# Patient Record
Sex: Female | Born: 1940 | Race: White | Hispanic: No | Marital: Married | Smoking: Never smoker
Health system: Southern US, Community
[De-identification: ages and names within clinical notes are randomized; demographics above are authoritative.]

## PROBLEM LIST (undated history)

## (undated) DIAGNOSIS — I10 Essential (primary) hypertension: Secondary | ICD-10-CM

## (undated) HISTORY — DX: Essential (primary) hypertension: I10

---

## 2003-10-21 ENCOUNTER — Emergency Department (HOSPITAL_COMMUNITY): Admission: EM | Admit: 2003-10-21 | Discharge: 2003-10-21 | Payer: Self-pay | Admitting: Emergency Medicine

## 2004-01-11 ENCOUNTER — Ambulatory Visit (HOSPITAL_BASED_OUTPATIENT_CLINIC_OR_DEPARTMENT_OTHER): Admission: RE | Admit: 2004-01-11 | Discharge: 2004-01-11 | Payer: Self-pay | Admitting: Specialist

## 2004-01-11 ENCOUNTER — Ambulatory Visit (HOSPITAL_COMMUNITY): Admission: RE | Admit: 2004-01-11 | Discharge: 2004-01-11 | Payer: Self-pay | Admitting: Specialist

## 2006-06-08 ENCOUNTER — Emergency Department (HOSPITAL_COMMUNITY): Admission: EM | Admit: 2006-06-08 | Discharge: 2006-06-08 | Payer: Self-pay | Admitting: Emergency Medicine

## 2006-06-11 ENCOUNTER — Encounter (HOSPITAL_COMMUNITY): Admission: RE | Admit: 2006-06-11 | Discharge: 2006-09-08 | Payer: Self-pay | Admitting: Family Medicine

## 2006-10-18 ENCOUNTER — Emergency Department (HOSPITAL_COMMUNITY): Admission: EM | Admit: 2006-10-18 | Discharge: 2006-10-18 | Payer: Self-pay | Admitting: Emergency Medicine

## 2007-01-22 ENCOUNTER — Encounter: Admission: RE | Admit: 2007-01-22 | Discharge: 2007-01-22 | Payer: Self-pay | Admitting: Family Medicine

## 2007-11-07 ENCOUNTER — Emergency Department (HOSPITAL_COMMUNITY): Admission: EM | Admit: 2007-11-07 | Discharge: 2007-11-07 | Payer: Self-pay | Admitting: Emergency Medicine

## 2008-01-31 ENCOUNTER — Encounter: Admission: RE | Admit: 2008-01-31 | Discharge: 2008-01-31 | Payer: Self-pay | Admitting: Family Medicine

## 2010-05-24 NOTE — Op Note (Signed)
Leah Castaneda, Leah Castaneda              ACCOUNT NO.:  [id number]   MEDICAL RECORD NO.:  [id number]          PATIENT TYPE:  AMB   LOCATION:  [facility]                         FACILITY:  [facility]   PHYSICIAN:  Jene Every, M.D.    DATE OF BIRTH:  1940/05/04   DATE OF PROCEDURE:  01/11/2004  DATE OF DISCHARGE:                                 OPERATIVE REPORT   PREOPERATIVE DIAGNOSES:  1.  Medial meniscal tear, left knee.  2.  Degenerative joint disease.   POSTOPERATIVE DIAGNOSES:  1.  Medial meniscal tear, left knee.  2.  Degenerative joint disease.   PROCEDURE PERFORMED:  Left knee arthroscopy with partial medial meniscectomy  and chondroplasty of medial femoral condyle and medial tibial plateau of  patella, removal of loose body.   BRIEF HISTORY AND INDICATION:  A 70 year old with refractory knee pain, MRI  indicating meniscal tear and degenerative changes.  Operative intervention  was indicated for a partial medial meniscectomy and debridement.  Risks and  benefits were discussed including bleeding, infection, __________ , no  change in symptoms, worsening symptoms and need for repeat in the future or  a total knee arthroplasty, etc.   TECHNIQUE:  The patient was placed in a supine position and after receiving  adequate general anesthesia and 1 g of Kefzol, the left lower extremity was  prepped and draped in the usual sterile fashion.  A lateral parapatellar  portal and superomedial parapatellar portal was fashioned with a #11 blade  and the ingress cannulae atraumatically placed.  __________  utilized to  insufflate the joint.   The ingress cannulae were atraumatically placed under direct visualization  and the medial parapatellar portal was fashioned with a #11 blade after  localization with an 18-gauge needle, sparing the medial meniscus.  Inspection revealed some grade 3 changes of the patella.  There was normal  patellofemoral tracking.  The medial compartment revealed extensive  grade 3  changes of the medial femoral condyle and tibial plateau.  There were loose  cartilaginous bodies noticed as well and a posterior horn medial meniscus  tear that was somewhat unstable to probe palpation.  There was no bucket-  handle tear that was described by the MRI, however.  We introduced a shaver  and utilized it to perform a chondroplasty of the medial femoral condyle and  tibial plateau, and removed the loose bodies with a straight basket and a  pituitary rongeur, and performed a partial medial meniscectomy with a  straight basket and further contoured with a 4.2 Cuda shaver to a stable  base.  The remnant of the other meniscus was stable; the majority of it was  still remaining, however.  I found no grade 4 changes though.  With flexion  and extension we fully debrided the compartment.  ACL and PCL were  attenuated.  The lateral compartment revealed some mild grade 3 changes of  the femoral condyle; this was shaved.  The meniscus and tibial plateau were  unremarkable.  The gutters were unremarkable.  We fully lavaged all  compartments, reexamined the medial meniscus and the residual was stable to  probe palpation and no evidence of residual loose cartilaginous bodies.  The  knee was copiously lavaged and all instrumentation was removed.  Portals  were closed with 4-0 nylon simple interrupted sutures.  Marcaine 0.25% with epinephrine was infiltrated in the in the joint.  Wound  was dressed sterilely.  She was awoken without difficulty and transported to  the recovery room in satisfactory condition.   The patient tolerated the procedure well with no complications and minimal  blood loss.     Trey Paula   JB/MEDQ  D:  01/11/2004  T:  01/11/2004  Job:  [id number]

## 2010-08-24 ENCOUNTER — Emergency Department (HOSPITAL_COMMUNITY)
Admission: EM | Admit: 2010-08-24 | Discharge: 2010-08-24 | Disposition: A | Discharge status: Home / Self Care | Payer: Medicare Other | Attending: Emergency Medicine | Admitting: Emergency Medicine

## 2010-08-24 DIAGNOSIS — I10 Essential (primary) hypertension: Secondary | ICD-10-CM | POA: Insufficient documentation

## 2010-08-24 DIAGNOSIS — R1310 Dysphagia, unspecified: Secondary | ICD-10-CM | POA: Insufficient documentation

## 2010-08-24 DIAGNOSIS — E7800 Pure hypercholesterolemia, unspecified: Secondary | ICD-10-CM | POA: Insufficient documentation

## 2010-08-26 ENCOUNTER — Ambulatory Visit (HOSPITAL_COMMUNITY)
Admit: 2010-08-26 | Discharge: 2010-08-26 | Disposition: A | Discharge status: Home / Self Care | Payer: Medicare Other | Attending: Emergency Medicine | Admitting: Emergency Medicine

## 2010-08-26 DIAGNOSIS — K224 Dyskinesia of esophagus: Secondary | ICD-10-CM | POA: Insufficient documentation

## 2010-08-26 DIAGNOSIS — R1310 Dysphagia, unspecified: Secondary | ICD-10-CM | POA: Insufficient documentation

## 2010-08-26 DIAGNOSIS — K225 Diverticulum of esophagus, acquired: Secondary | ICD-10-CM | POA: Insufficient documentation

## 2010-08-26 DIAGNOSIS — K229 Disease of esophagus, unspecified: Secondary | ICD-10-CM | POA: Insufficient documentation

## 2013-12-06 ENCOUNTER — Other Ambulatory Visit: Payer: Self-pay | Admitting: Neurology

## 2013-12-06 ENCOUNTER — Encounter: Payer: Self-pay | Admitting: Neurology

## 2013-12-06 ENCOUNTER — Ambulatory Visit (INDEPENDENT_AMBULATORY_CARE_PROVIDER_SITE_OTHER): Payer: Medicare Other | Admitting: Neurology

## 2013-12-06 VITALS — BP 116/62 | HR 70 | Temp 97.5°F | Resp 18 | Ht 65.0 in | Wt 134.9 lb

## 2013-12-06 DIAGNOSIS — G309 Alzheimer's disease, unspecified: Secondary | ICD-10-CM

## 2013-12-06 DIAGNOSIS — F0280 Dementia in other diseases classified elsewhere without behavioral disturbance: Secondary | ICD-10-CM

## 2013-12-06 LAB — TSH: TSH: 1.393 u[IU]/mL (ref 0.350–4.500)

## 2013-12-06 LAB — VITAMIN B12: Vitamin B-12: 231 pg/mL (ref 211–911)

## 2013-12-06 MED ORDER — MEMANTINE HCL 10 MG PO TABS: ORAL_TABLET | ORAL | Status: DC

## 2013-12-06 NOTE — Progress Notes (Signed)
NEUROLOGY CONSULTATION NOTE  Leah Castaneda MRN: [id number] DOB: 1940/12/25  Referring provider: Dr. Manus GunningEhinger Primary care provider: Dr. Manus GunningEhinger  Reason for consult:  Memory loss  HISTORY OF PRESENT ILLNESS: Leah Castaneda is a 73 year old right-handed woman with hypertension, hypercholesterolemia, and hearing loss who presents for memory loss.  Records and labs reviewed.  Additional history obtained from her daughter.  She began having memory problems shortly after her husband passed away in February 2014.  At first, it was thought to be related to depression, but the memory problems gradually progressed.  She often forgets where she placed objects, such as in the kitchen.  On occasion, she has left her keys and wallet at the grocery store.  She has at least twice gotten lost while driving.  However, on one of those occasions, it occurred at night where there was construction taking place.  She did not have any accidents or near-accidents.  She will have both word-finding difficulties and problems getting words out.  Sometimes, she will refer to "he" as "she" or vice versa.  She currently lives by herself in an attached apartment.  Her neighbor usually puts on the alarm at night, however she is unable to do it herself when her neighbor is away.  She has accidentally set off the alarm.  Sometimes, she will call family members in the middle of the night.  However, she typically sleeps well.  Up until now, she has always successfully managed her finances.  She recently mailed back a receipt voucher without the check.  Her daughter is planning on handling her bills now.  She is able to perform all her activities of daily living.  She keeps her home clean.  She denies feeling depressed.  She has not had any change in behavior, personality or mood.  She has not had any delusions or hallucinations.  She has not been confused or agitated.   She has no family history of dementia.  MMSE from 08/27/12 was  25/30.  PAST MEDICAL HISTORY: Past Medical History  Diagnosis Date  . Hypertension     PAST SURGICAL HISTORY: No past surgical history on file.  MEDICATIONS: No current outpatient prescriptions on file prior to visit.   No current facility-administered medications on file prior to visit.    ALLERGIES: Not on File  FAMILY HISTORY: Family History  Problem Relation Age of Onset  . Cancer Brother     lung    SOCIAL HISTORY: History   Social History  . Marital Status: Married    Spouse Name: N/A    Number of Children: N/A  . Years of Education: N/A   Occupational History  . Not on file.   Social History Main Topics  . Smoking status: Never Smoker   . Smokeless tobacco: Never Used  . Alcohol Use: No  . Drug Use: No  . Sexual Activity: No   Other Topics Concern  . Not on file   Social History Narrative  . No narrative on file    REVIEW OF SYSTEMS: Constitutional: No fevers, chills, or sweats, no generalized fatigue, change in appetite Eyes: No visual changes, double vision, eye pain Ear, nose and throat: No hearing loss, ear pain, nasal congestion, sore throat Cardiovascular: No chest pain, palpitations Respiratory:  No shortness of breath at rest or with exertion, wheezes GastrointestinaI: No nausea, vomiting, diarrhea, abdominal pain, fecal incontinence Genitourinary:  No dysuria, urinary retention or frequency Musculoskeletal:  No neck pain, back pain Integumentary: No  rash, pruritus, skin lesions Neurological: as above Psychiatric: No depression, insomnia, anxiety Endocrine: No palpitations, fatigue, diaphoresis, mood swings, change in appetite, change in weight, increased thirst Hematologic/Lymphatic:  No anemia, purpura, petechiae. Allergic/Immunologic: no itchy/runny eyes, nasal congestion, recent allergic reactions, rashes  PHYSICAL EXAM: Filed Vitals:   12/06/13 1517  BP: 116/62  Pulse: 70  Temp: 97.5 F (36.4 C)  Resp: 18   General: No  acute distress Head:  Normocephalic/atraumatic Eyes:  fundi unremarkable, without vessel changes, exudates, hemorrhages or papilledema. Neck: supple, no paraspinal tenderness, full range of motion Back: No paraspinal tenderness Heart: regular rate and rhythm Lungs: Clear to auscultation bilaterally. Vascular: No carotid bruits. Neurological Exam: Mental status: alert and oriented to person and place, but not time.  Recent memory poor.  Remote memory intact. Fund of knowledge intact (does not know who is the president), attention and concentration poor.  Speech fluent and not dysarthric, able to name and follow commands but difficulty with exact repetition and naming fluency. Montreal Cognitive Assessment  12/06/2013  Visuospatial/ Executive (0/5) 0  Naming (0/3) 3  Attention: Read list of digits (0/2) 0  Attention: Read list of letters (0/1) 1  Attention: Serial 7 subtraction starting at 100 (0/3) 0  Language: Repeat phrase (0/2) 0  Language : Fluency (0/1) 0  Abstraction (0/2) 1  Delayed Recall (0/5) 0  Orientation (0/6) 2  Total 7  Adjusted Score (based on education) 8   Cranial nerves: CN I: not tested CN II: pupils equal, round and reactive to light, visual fields intact, fundi unremarkable, without vessel changes, exudates, hemorrhages or papilledema. CN III, IV, VI:  full range of motion, no nystagmus, no ptosis CN V: facial sensation intact CN VII: upper and lower face symmetric CN VIII: hearing intact CN IX, X: gag intact, uvula midline CN XI: sternocleidomastoid and trapezius muscles intact CN XII: tongue midline Bulk & Tone: normal, no fasciculations. Motor:  5/5 throughout Sensation:  Pinprick and vibration intact Deep Tendon Reflexes:  2+ throughout, toes downgoing Finger to nose testing:  No dysmetria Gait:  Normal station and stride. Romberg negative.  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  Will initiate Namenda titration.  Side effects  discussed. 2.  Will get MRI of the brain without contrast 3.  Will check B12 and TSH 4.  Recommend starting brain teasers and exercising 5.  I advise that she not drive.  She may get a formal driving test if she disputes this. 6.  Provided information on local Alzheimer support group and websites 7.  Follow up in 6 months.  Thank you for allowing me to take part in the care of this patient.  Shon MilletAdam Zacchaeus Halm, DO  CC:  Blair Heysobert Ehinger, MD

## 2013-12-06 NOTE — Patient Instructions (Addendum)
1.  Start Namenda (memantine) 10mg  tablets.  Take 1/2 tablet at bedtime for 7 days, then 1/2 tablet twice daily for 7 days, then 1/2 tablet in morning and 1 tablet at bedtime for 7 days, then 1 tablet twice daily.   Side effects include dizziness, headache, diarrhea or constipation.  Call with any questions or concerns.  2.  We will check MRI of the brain without contrast 3.  We will check B12 and TSH 4.  I cannot advise you to drive.  If you would like to pursue driving, I will give you a number for an occupational therapist who can assess your driving safety 5.  Stay active.  Be social.  Exercise daily.  Play word search puzzles and brain teasers 6.  Follow up in 6 months.

## 2013-12-11 LAB — METHYLMALONIC ACID, SERUM: Methylmalonic Acid, Quant: 53 nmol/L — ABNORMAL LOW (ref 87–318)

## 2013-12-12 ENCOUNTER — Other Ambulatory Visit: Payer: Self-pay | Admitting: *Deleted

## 2013-12-12 ENCOUNTER — Telehealth: Payer: Self-pay | Admitting: Neurology

## 2013-12-12 DIAGNOSIS — F0280 Dementia in other diseases classified elsewhere without behavioral disturbance: Secondary | ICD-10-CM

## 2013-12-12 DIAGNOSIS — G309 Alzheimer's disease, unspecified: Principal | ICD-10-CM

## 2013-12-12 NOTE — Telephone Encounter (Signed)
Leah Castaneda, pt;s daughter called wanting to speak to a nurse regarding her mother's MRI appt. Please call Leah Castaneda # [phone]

## 2013-12-23 ENCOUNTER — Ambulatory Visit (HOSPITAL_COMMUNITY)
Admission: RE | Admit: 2013-12-23 | Discharge: 2013-12-23 | Disposition: A | Discharge status: Home / Self Care | Payer: Medicare Other | Source: Ambulatory Visit | Attending: Neurology | Admitting: Neurology

## 2013-12-23 DIAGNOSIS — R410 Disorientation, unspecified: Secondary | ICD-10-CM | POA: Diagnosis not present

## 2013-12-23 DIAGNOSIS — F0280 Dementia in other diseases classified elsewhere without behavioral disturbance: Secondary | ICD-10-CM

## 2013-12-23 DIAGNOSIS — G309 Alzheimer's disease, unspecified: Secondary | ICD-10-CM

## 2013-12-27 ENCOUNTER — Telehealth: Payer: Self-pay | Admitting: Neurology

## 2013-12-27 NOTE — Telephone Encounter (Signed)
Attempted to call patient to inform her of results of MRI of brain.  She did not pick up and there was no opportunity to leave a message.  Will try later.

## 2014-01-09 ENCOUNTER — Telehealth: Payer: Self-pay | Admitting: Neurology

## 2014-01-09 ENCOUNTER — Telehealth: Payer: Self-pay | Admitting: *Deleted

## 2014-01-09 NOTE — Telephone Encounter (Signed)
memantine (NAMENDA) 10 MG tablet #60 1 po bid with 3 refills

## 2014-01-09 NOTE — Telephone Encounter (Signed)
Pt daughter called and said that she needs to talk to someone about the medication refill please call theresa at [phone]

## 2014-01-15 ENCOUNTER — Emergency Department (HOSPITAL_BASED_OUTPATIENT_CLINIC_OR_DEPARTMENT_OTHER)
Admission: EM | Admit: 2014-01-15 | Discharge: 2014-01-15 | Disposition: A | Discharge status: Home / Self Care | Payer: Medicare Other | Attending: Emergency Medicine | Admitting: Emergency Medicine

## 2014-01-15 ENCOUNTER — Encounter (HOSPITAL_BASED_OUTPATIENT_CLINIC_OR_DEPARTMENT_OTHER): Payer: Self-pay | Admitting: *Deleted

## 2014-01-15 DIAGNOSIS — Y998 Other external cause status: Secondary | ICD-10-CM | POA: Insufficient documentation

## 2014-01-15 DIAGNOSIS — Z791 Long term (current) use of non-steroidal anti-inflammatories (NSAID): Secondary | ICD-10-CM | POA: Diagnosis not present

## 2014-01-15 DIAGNOSIS — Y9389 Activity, other specified: Secondary | ICD-10-CM | POA: Diagnosis not present

## 2014-01-15 DIAGNOSIS — W01198A Fall on same level from slipping, tripping and stumbling with subsequent striking against other object, initial encounter: Secondary | ICD-10-CM | POA: Insufficient documentation

## 2014-01-15 DIAGNOSIS — F0390 Unspecified dementia without behavioral disturbance: Secondary | ICD-10-CM | POA: Diagnosis not present

## 2014-01-15 DIAGNOSIS — Z792 Long term (current) use of antibiotics: Secondary | ICD-10-CM | POA: Diagnosis not present

## 2014-01-15 DIAGNOSIS — Y92009 Unspecified place in unspecified non-institutional (private) residence as the place of occurrence of the external cause: Secondary | ICD-10-CM | POA: Insufficient documentation

## 2014-01-15 DIAGNOSIS — I10 Essential (primary) hypertension: Secondary | ICD-10-CM | POA: Insufficient documentation

## 2014-01-15 DIAGNOSIS — W19XXXA Unspecified fall, initial encounter: Secondary | ICD-10-CM

## 2014-01-15 DIAGNOSIS — S0121XA Laceration without foreign body of nose, initial encounter: Secondary | ICD-10-CM | POA: Insufficient documentation

## 2014-01-15 MED ORDER — CEPHALEXIN 500 MG PO CAPS: 500.0000 mg | ORAL_CAPSULE | Freq: Four times a day (QID) | ORAL | Status: DC

## 2014-01-15 MED ORDER — LIDOCAINE-EPINEPHRINE 1 %-1:100000 IJ SOLN
10.0000 mL | Freq: Once | INTRAMUSCULAR | Status: AC
  Administered 2014-01-15: 10 mL
  Filled 2014-01-15: qty 1

## 2014-01-15 MED ORDER — NAPROXEN 500 MG PO TABS: 500.0000 mg | ORAL_TABLET | Freq: Two times a day (BID) | ORAL | Status: AC

## 2014-01-15 MED ORDER — TETANUS-DIPHTH-ACELL PERTUSSIS 5-2.5-18.5 LF-MCG/0.5 IM SUSP
0.5000 mL | Freq: Once | INTRAMUSCULAR | Status: AC
  Administered 2014-01-15: 0.5 mL via INTRAMUSCULAR
  Filled 2014-01-15: qty 0.5

## 2014-01-15 MED ORDER — BACITRACIN ZINC 500 UNIT/GM EX OINT: 1.0000 "application " | TOPICAL_OINTMENT | Freq: Two times a day (BID) | CUTANEOUS | Status: AC

## 2014-01-15 NOTE — ED Notes (Signed)
Patient was trying to put on socks and may have fallen over hitting her face on the, which was tile. Now has a laceration to her nose.

## 2014-01-15 NOTE — Discharge Instructions (Signed)

## 2014-01-15 NOTE — ED Provider Notes (Signed)
CSN: [id number]     Arrival date & time 01/15/14  1338 History   First MD Initiated Contact with Patient 01/15/14 1456     Chief Complaint  Patient presents with  . Facial Injury     (Consider location/radiation/quality/duration/timing/severity/associated sxs/prior Treatment) HPI Comments: The patient is a 74 year old female, she has a history of a recent diagnosis of dementia, she is unable to give any valuable information. She also has hypertension. The family reports that approximately 4 hours ago she fell forward from a seated position while trying to put her socks on, she struck her face on something, they believe it was the floor and suffered an injury to her nose. She has had minimal complaints, minimal bleeding, no loss of consciousness, no seizures, no vomiting, no other complaints. A bandage was placed prior to arrival. The patient is not on anticoagulants  Patient is a 74 y.o. female presenting with facial injury. The history is provided by the patient and a relative.  Facial Injury   Past Medical History  Diagnosis Date  . Hypertension    History reviewed. No pertinent past surgical history. Family History  Problem Relation Age of Onset  . Cancer Brother     lung   History  Substance Use Topics  . Smoking status: Never Smoker   . Smokeless tobacco: Never Used  . Alcohol Use: No   OB History    No data available     Review of Systems  Unable to perform ROS: Dementia      Allergies  Review of patient's allergies indicates not on file.  Home Medications   Prior to Admission medications   Medication Sig Start Date End Date Taking? Authorizing Provider  atenolol (TENORMIN) 100 MG tablet Take 100 mg by mouth daily.    Historical Provider, MD  atorvastatin (LIPITOR) 10 MG tablet  09/19/13   Historical Provider, MD  bacitracin ointment Apply 1 application topically 2 (two) times daily. 01/15/14   Vida RollerBrian D Jairus Tonne, MD  cephALEXin (KEFLEX) 500 MG capsule Take 1  capsule (500 mg total) by mouth 4 (four) times daily. 01/15/14   Vida RollerBrian D Maila Dukes, MD  memantine (NAMENDA) 10 MG tablet Take 0.5tab qhs x7d, then 0.5tab BID x7d, then 0.5tab qAM and 1tab qHS x7d, then 1tab BID. 12/06/13   Cira ServantAdam Robert Jaffe, DO  naproxen (NAPROSYN) 500 MG tablet Take 1 tablet (500 mg total) by mouth 2 (two) times daily with a meal. 01/15/14   Vida RollerBrian D Zakyria Metzinger, MD  NIFEdipine (PROCARDIA-XL/ADALAT CC) 60 MG 24 hr tablet Take 60 mg by mouth daily. 11/23/13   Historical Provider, MD   BP 122/83 mmHg  Pulse 70  Temp(Src) 99 F (37.2 C) (Oral)  Resp 18  Wt 140 lb (63.504 kg)  SpO2 100% Physical Exam  Constitutional: She appears well-developed and well-nourished. No distress.  HENT:  Head: Normocephalic.  Mouth/Throat: Oropharynx is clear and moist. No oropharyngeal exudate.  no facial tenderness, deformity, malocclusion or hemotympanum.  no battle's sign or racoon eyes. No nasal septal hematoma, 1.5 cm laceration over the nasal bridge, no tenderness over the nasal bridge, no exposed bone, 7 mm abrasion laceration to the upper lip, 1 cm superficial laceration to the inner buccal mucosa of the upper lip, upper right central incisor with small chip  Eyes: Conjunctivae and EOM are normal. Pupils are equal, round, and reactive to light. Right eye exhibits no discharge. Left eye exhibits no discharge. No scleral icterus.  Neck: Normal range of motion. Neck supple.  No JVD present. No thyromegaly present.  Cardiovascular: Normal rate, regular rhythm, normal heart sounds and intact distal pulses.  Exam reveals no gallop and no friction rub.   No murmur heard. Pulmonary/Chest: Effort normal and breath sounds normal. No respiratory distress. She has no wheezes. She has no rales.  Abdominal: Soft. Bowel sounds are normal. She exhibits no distension and no mass. There is no tenderness.  Musculoskeletal: Normal range of motion. She exhibits no edema or tenderness.  Joints are all supple, compartments  are all soft, follows commands without difficulty, no pain with range of motion of all major joints  Lymphadenopathy:    She has no cervical adenopathy.  Neurological: She is alert. Coordination normal.  Skin: Skin is warm and dry. No rash noted. No erythema.  Psychiatric: She has a normal mood and affect. Her behavior is normal.  Nursing note and vitals reviewed.   ED Course  Procedures (including critical care time) Labs Review Labs Reviewed - No data to display  Imaging Review No results found.    MDM   Final diagnoses:  Laceration of nose without complication, initial encounter  Fall, initial encounter    No cervical spine tenderness, no other complaints, no obvious injuries on exam other than her small facial injury, this will be easily repaired with sutures, the family is in agreement. They state that the patient has slight uneasiness on her feet, this has been chronic, no other acute reasons for investigation into the source of the patient's fall is warranted at this time. Vital signs are normal.  Filed Vitals:   01/15/14 1350  BP: 122/83  Pulse: 70  Temp: 99 F (37.2 C)  Resp: 18    LACERATION REPAIR Performed by: Vida Roller Authorized by: Vida Roller Consent: Verbal consent obtained. Risks and benefits: risks, benefits and alternatives were discussed Consent given by: patient Patient identity confirmed: provided demographic data Prepped and Draped in normal sterile fashion Wound explored  Laceration Location: Nasal bridge  Laceration Length: 1.5 cm  No Foreign Bodies seen or palpated  Anesthesia: local infiltration  Local anesthetic: lidocaine 1 % with epinephrine  Anesthetic total: 1 ml  Irrigation method: syringe Amount of cleaning: standard  Skin closure: 7-0 Prolene   Number of sutures: 3   Technique: Simple interrupted   Patient tolerance: Patient tolerated the procedure well with no immediate complications.  Meds given in  ED:  Medications  lidocaine-EPINEPHrine (XYLOCAINE W/EPI) 1 %-1:100000 (with pres) injection 10 mL (10 mLs Other Given by Other 01/15/14 1524)  Tdap (BOOSTRIX) injection 0.5 mL (0.5 mLs Intramuscular Given 01/15/14 1523)    New Prescriptions   BACITRACIN OINTMENT    Apply 1 application topically 2 (two) times daily.   CEPHALEXIN (KEFLEX) 500 MG CAPSULE    Take 1 capsule (500 mg total) by mouth 4 (four) times daily.   NAPROXEN (NAPROSYN) 500 MG TABLET    Take 1 tablet (500 mg total) by mouth 2 (two) times daily with a meal.      Vida Roller, MD 01/15/14 [phone]

## 2014-06-14 ENCOUNTER — Ambulatory Visit: Payer: Medicare Other | Admitting: Neurology

## 2014-06-21 ENCOUNTER — Ambulatory Visit: Payer: Medicare Other | Admitting: Neurology

## 2014-06-23 ENCOUNTER — Ambulatory Visit (INDEPENDENT_AMBULATORY_CARE_PROVIDER_SITE_OTHER): Payer: Medicare Other | Admitting: Neurology

## 2014-06-23 ENCOUNTER — Encounter: Payer: Self-pay | Admitting: Neurology

## 2014-06-23 VITALS — BP 122/70 | HR 78 | Resp 16 | Ht 65.0 in | Wt 151.9 lb

## 2014-06-23 DIAGNOSIS — F0280 Dementia in other diseases classified elsewhere without behavioral disturbance: Secondary | ICD-10-CM | POA: Insufficient documentation

## 2014-06-23 DIAGNOSIS — G309 Alzheimer's disease, unspecified: Secondary | ICD-10-CM | POA: Diagnosis not present

## 2014-06-23 NOTE — Patient Instructions (Signed)
Continue Namenda 10mg twice daily Follow up in 6 months. 

## 2014-06-23 NOTE — Progress Notes (Signed)
NEUROLOGY FOLLOW UP OFFICE NOTE  Leah Castaneda [id number]  HISTORY OF PRESENT ILLNESS: Leah Castaneda is a 74 year old right-handed woman with hypertension, hypercholesterolemia, and hearing loss who follows up for Alzheimer's dementia.  MRI of brain and labs reviewed.  She is accompanied by her daughter who provides some history.  UPDATE: MRI of the brain performed on 12/23/13 showed disproportionate temporal and parietal lobe volume loss with mild nonspecific white matter changes.  TSH was 1.393.  B12 was 231 with methylmalonic acid level of 53.    She was started on Namenda, now taking  twice daily.  She is doing well.  There has been no decline.  Her daughter and son-in-law have moved in with her.  Her daughter typically sets up her pillbox, although she did it herself recently.  She takes a walk around the circular driveway about 3 times a day.  Her daughter works from home, so she is never alone.  She sleeps well.  She reads the paper everyday and follows up with the news.  She no longer drives.  HISTORY: She began having memory problems shortly after her husband passed away in Feb 21, 2012.  At first, it was thought to be related to depression, but the memory problems gradually progressed.  She often forgets where she placed objects, such as in the kitchen.  On occasion, she has left her keys and wallet at the grocery store.  She has at least twice gotten lost while driving.  However, on one of those occasions, it occurred at night where there was construction taking place.  She did not have any accidents or near-accidents.  She will have both word-finding difficulties and problems getting words out.  Sometimes, she will refer to "he" as "she" or vice versa.  She currently lives by herself in an attached apartment.  Her neighbor usually puts on the alarm at night, however she is unable to do it herself when her neighbor is away.  She has accidentally set off the alarm.  Sometimes, she  will call family members in the middle of the night.  However, she typically sleeps well.  Up until now, she has always successfully managed her finances.  She recently mailed back a receipt voucher without the check.  Her daughter is planning on handling her bills now.  She is able to perform all her activities of daily living.  She keeps her home clean.  She denies feeling depressed.  She has not had any change in behavior, personality or mood.  She has not had any delusions or hallucinations.  She has not been confused or agitated.  PAST MEDICAL HISTORY: Past Medical History  Diagnosis Date  . Hypertension     MEDICATIONS: Current Outpatient Prescriptions on File Prior to Visit  Medication Sig Dispense Refill  . memantine (NAMENDA) 10 MG tablet Take 0.5tab qhs x7d, then 0.5tab BID x7d, then 0.5tab qAM and 1tab qHS x7d, then 1tab BID. 60 tablet 0  . naproxen (NAPROSYN) 500 MG tablet Take 1 tablet (500 mg total) by mouth 2 (two) times daily with a meal. 30 tablet 0  . atenolol (TENORMIN) 100 MG tablet Take 100 mg by mouth daily.    Marland Kitchen atorvastatin (LIPITOR) 10 MG tablet   3  . bacitracin ointment Apply 1 application topically 2 (two) times daily. (Patient not taking: Reported on 06/23/2014) 120 g 0  . cephALEXin (KEFLEX) 500 MG capsule Take 1 capsule (500 mg total) by mouth 4 (four) times daily. (  Patient not taking: Reported on 06/23/2014) 40 capsule 0  . NIFEdipine (PROCARDIA-XL/ADALAT CC) 60 MG 24 hr tablet Take 60 mg by mouth daily.  3   No current facility-administered medications on file prior to visit.    ALLERGIES: Not on File  FAMILY HISTORY: Family History  Problem Relation Age of Onset  . Cancer Brother     lung    SOCIAL HISTORY: History   Social History  . Marital Status: Married    Spouse Name: N/A  . Number of Children: N/A  . Years of Education: N/A   Occupational History  . Not on file.   Social History Main Topics  . Smoking status: Never Smoker   .  Smokeless tobacco: Never Used  . Alcohol Use: No  . Drug Use: No  . Sexual Activity: No   Other Topics Concern  . Not on file   Social History Narrative    REVIEW OF SYSTEMS: Constitutional: No fevers, chills, or sweats, no generalized fatigue, change in appetite Eyes: No visual changes, double vision, eye pain Ear, nose and throat: No hearing loss, ear pain, nasal congestion, sore throat Cardiovascular: No chest pain, palpitations Respiratory:  No shortness of breath at rest or with exertion, wheezes GastrointestinaI: No nausea, vomiting, diarrhea, abdominal pain, fecal incontinence Genitourinary:  No dysuria, urinary retention or frequency Musculoskeletal:  No neck pain, back pain Integumentary: No rash, pruritus, skin lesions Neurological: as above Psychiatric: No depression, insomnia, anxiety Endocrine: No palpitations, fatigue, diaphoresis, mood swings, change in appetite, change in weight, increased thirst Hematologic/Lymphatic:  No anemia, purpura, petechiae. Allergic/Immunologic: no itchy/runny eyes, nasal congestion, recent allergic reactions, rashes  PHYSICAL EXAM: Filed Vitals:   06/23/14 1408  BP: 122/70  Pulse: 78  Resp: 16   General: No acute distress Head:  Normocephalic/atraumatic Eyes:  Fundoscopic exam unremarkable without vessel changes, exudates, hemorrhages or papilledema. Neck: supple, no paraspinal tenderness, full range of motion Heart:  Regular rate and rhythm Lungs:  Clear to auscultation bilaterally Back: No paraspinal tenderness Neurological Exam: alert and oriented to person, place, and time. Attention span and concentration fair, delayed recall poor, remote memory intact, fund of knowledge intact.  Speech fluent but at times hesitant and not dysarthric, Some difficulty with naming and repeating.   MMSE - Mini Mental State Exam 06/23/2014  Orientation to time 3  Orientation to Place 4  Registration 3  Attention/ Calculation 0  Recall 0    Language- name 2 objects 1  Language- repeat 0  Language- follow 3 step command 3  Language- read & follow direction 1  Write a sentence 1  Copy design 0  Total score 16   CN II-XII intact. Fundoscopic exam unremarkable without vessel changes, exudates, hemorrhages or papilledema.  Bulk and tone normal, muscle strength 5/5 throughout.  Sensation to light touch, temperature and vibration intact.  Deep tendon reflexes 2+ throughout.  Finger to nose testing intact.  Gait normal, Romberg negative.  IMPRESSION: Alzheimer's dementia  PLAN: Continue Namenda 10mg  twice daily Follow up in 6 months.  [name], DO  CC:  Blair Heys, MD

## 2014-12-25 ENCOUNTER — Encounter: Payer: Self-pay | Admitting: Neurology

## 2014-12-25 ENCOUNTER — Ambulatory Visit (INDEPENDENT_AMBULATORY_CARE_PROVIDER_SITE_OTHER): Payer: Medicare Other | Admitting: Neurology

## 2014-12-25 VITALS — BP 158/64 | HR 62 | Wt 165.0 lb

## 2014-12-25 DIAGNOSIS — G309 Alzheimer's disease, unspecified: Secondary | ICD-10-CM

## 2014-12-25 DIAGNOSIS — F0280 Dementia in other diseases classified elsewhere without behavioral disturbance: Secondary | ICD-10-CM | POA: Diagnosis not present

## 2014-12-25 DIAGNOSIS — I10 Essential (primary) hypertension: Secondary | ICD-10-CM | POA: Diagnosis not present

## 2014-12-25 MED ORDER — DONEPEZIL HCL 5 MG PO TABS: 5.0000 mg | ORAL_TABLET | Freq: Every day | ORAL | Status: DC

## 2014-12-25 NOTE — Patient Instructions (Signed)
We will start donepezil (Aricept) 5mg  daily for four weeks.  If you are tolerating the medication, then after four weeks, we will increase the dose to 10mg  daily.  Side effects include nausea, vomiting, diarrhea, vivid dreams, and muscle cramps.  Please call the clinic if you experience any of these symptoms.  Continue the memantine  Follow up in 9 months.

## 2014-12-25 NOTE — Progress Notes (Signed)
NEUROLOGY FOLLOW UP OFFICE NOTE  Leah Castaneda [id number]  HISTORY OF PRESENT ILLNESS: Leah Castaneda is a 74 year old right-handed woman with hypertension, hypercholesterolemia, and hearing loss who follows up for Alzheimer's dementia.  MRI of brain and labs reviewed.  She is accompanied by her daughter who provides some history.  UPDATE: She was started on Namenda, now taking  twice daily.  She is doing well.  There has been no significant decline however she has more word-finding difficulties.  Her daughter and son-in-law live with her.  She is able to set up her medications herself.  Over the summer, she went for walks around the house.  Her daughter works from home, so she is never alone.  She sleeps well.  She reads the paper everyday and follows up with the news.  She no longer drives.  She dresses and bathes herself.  She has not had any change in behavior.  She denies depression.  HISTORY: She began having memory problems shortly after her husband passed away in 03-28-2012.  At first, it was thought to be related to depression, but the memory problems gradually progressed.  She often forgets where she placed objects, such as in the kitchen.  On occasion, she has left her keys and wallet at the grocery store.  She has at least twice gotten lost while driving.  However, on one of those occasions, it occurred at night where there was construction taking place.  She did not have any accidents or near-accidents.  She will have both word-finding difficulties and problems getting words out.  Sometimes, she will refer to "he" as "she" or vice versa.  She currently lives by herself in an attached apartment.  Her neighbor usually puts on the alarm at night, however she is unable to do it herself when her neighbor is away.  She has accidentally set off the alarm.  She has called family members in the middle of the night.   MRI of the brain performed on 12/23/13 showed disproportionate  temporal and parietal lobe volume loss with mild nonspecific white matter changes.    PAST MEDICAL HISTORY: Past Medical History  Diagnosis Date  . Hypertension     MEDICATIONS: Current Outpatient Prescriptions on File Prior to Visit  Medication Sig Dispense Refill  . atenolol (TENORMIN) 100 MG tablet Take 100 mg by mouth daily.    Marland Kitchen atorvastatin (LIPITOR) 10 MG tablet   3  . bacitracin ointment Apply 1 application topically 2 (two) times daily. 120 g 0  . hydrochlorothiazide (HYDRODIURIL) 12.5 MG tablet     . memantine (NAMENDA) 10 MG tablet Take 0.5tab qhs x7d, then 0.5tab BID x7d, then 0.5tab qAM and 1tab qHS x7d, then 1tab BID. 60 tablet 0  . naproxen (NAPROSYN) 500 MG tablet Take 1 tablet (500 mg total) by mouth 2 (two) times daily with a meal. 30 tablet 0   No current facility-administered medications on file prior to visit.    ALLERGIES: Not on File  FAMILY HISTORY: Family History  Problem Relation Age of Onset  . Cancer Brother     lung    SOCIAL HISTORY: Social History   Social History  . Marital Status: Married    Spouse Name: N/A  . Number of Children: N/A  . Years of Education: N/A   Occupational History  . Not on file.   Social History Main Topics  . Smoking status: Never Smoker   . Smokeless tobacco: Never Used  .  Alcohol Use: No  . Drug Use: No  . Sexual Activity: No   Other Topics Concern  . Not on file   Social History Narrative   Graduated HS.     REVIEW OF SYSTEMS: Constitutional: No fevers, chills, or sweats, no generalized fatigue, change in appetite Eyes: No visual changes, double vision, eye pain Ear, nose and throat: No hearing loss, ear pain, nasal congestion, sore throat Cardiovascular: No chest pain, palpitations Respiratory:  No shortness of breath at rest or with exertion, wheezes GastrointestinaI: No nausea, vomiting, diarrhea, abdominal pain, fecal incontinence Genitourinary:  No dysuria, urinary retention or  frequency Musculoskeletal:  No neck pain, back pain Integumentary: No rash, pruritus, skin lesions Neurological: as above Psychiatric: No depression, insomnia, anxiety Endocrine: No palpitations, fatigue, diaphoresis, mood swings, change in appetite, change in weight, increased thirst Hematologic/Lymphatic:  No anemia, purpura, petechiae. Allergic/Immunologic: no itchy/runny eyes, nasal congestion, recent allergic reactions, rashes  PHYSICAL EXAM: Filed Vitals:   12/25/14 1528  BP: 158/64  Pulse: 62   General: No acute distress.  Patient appears well-groomed.  normal body habitus. Head:  Normocephalic/atraumatic Eyes:  Fundoscopic exam unremarkable without vessel changes, exudates, hemorrhages or papilledema. Neck: supple, no paraspinal tenderness, full range of motion Heart:  Regular rate and rhythm Lungs:  Clear to auscultation bilaterally Back: No paraspinal tenderness Neurological Exam: alert and oriented to person only. Attention span and concentration poor, delayed recall poor, remote memory intact, fund of knowledge intact.  Speech fluent and not dysarthric, Some minor difficulty with naming.  Naming fluency poor.  Mild difficulty following commands.   Montreal Cognitive Assessment  12/25/2014 12/06/2013  Visuospatial/ Executive (0/5) 0 0  Naming (0/3) 2 3  Attention: Read list of digits (0/2) 1 0  Attention: Read list of letters (0/1) 0 1  Attention: Serial 7 subtraction starting at 100 (0/3) 1 0  Language: Repeat phrase (0/2) 1 0  Language : Fluency (0/1) 0 0  Abstraction (0/2) 1 1  Delayed Recall (0/5) 0 0  Orientation (0/6) 0 2  Total 6 7  Adjusted Score (based on education) 7 8   CN II-XII intact. Fundoscopic exam unremarkable without vessel changes, exudates, hemorrhages or papilledema.  Bulk and tone normal, muscle strength 5/5 throughout.  Sensation to light touch, temperature and vibration intact.  Deep tendon reflexes 2+ throughout, toes downgoing.  Finger to nose  and heel to shin testing intact.  Gait normal, Romberg negative.  IMPRESSION: Alzheimer's disease without behavioral changes, stable HTN  PLAN: 1.  Will add Aricept to Namenda.  Start 5mg  at bedtime and if tolerating in one month, will increase to 10mg  at bedtime 2.  Follow up BP with PCP 3.  Follow up in 9 to 12 months.  22 minutes spent face to face with patient, over 50% spent discussing management.  Shon MilletAdam Karlene Southard, DO  CC:  Blair Heysobert Ehinger, MD

## 2014-12-25 NOTE — Progress Notes (Signed)
Chart forwarded.  

## 2015-01-16 ENCOUNTER — Other Ambulatory Visit: Payer: Self-pay

## 2015-01-16 MED ORDER — DONEPEZIL HCL 5 MG PO TABS: 5.0000 mg | ORAL_TABLET | Freq: Every day | ORAL | Status: DC

## 2015-01-16 NOTE — Telephone Encounter (Signed)
Last OV: 12/25/14 Next OV: 09/25/15

## 2015-01-29 ENCOUNTER — Other Ambulatory Visit: Payer: Self-pay | Admitting: Neurology

## 2015-01-29 NOTE — Telephone Encounter (Signed)
Last OV: 12/25/14 Next OV: 09/25/15 .  Will add Aricept to Namenda.  Start  at bedtime and if tolerating in one month, will increase to  at bedtime

## 2015-02-15 ENCOUNTER — Encounter: Payer: Self-pay | Admitting: Neurology

## 2015-03-06 ENCOUNTER — Other Ambulatory Visit: Payer: Self-pay | Admitting: Neurology

## 2015-03-06 NOTE — Telephone Encounter (Signed)
Last OV: 12/25/14 Next OV: 09/25/15 1.  Will add Aricept to Namenda.  Start  at bedtime and if tolerating in one month, will increase to  at bedtime

## 2015-09-25 ENCOUNTER — Encounter: Payer: Self-pay | Admitting: Neurology

## 2015-09-25 ENCOUNTER — Ambulatory Visit (INDEPENDENT_AMBULATORY_CARE_PROVIDER_SITE_OTHER): Payer: Medicare Other | Admitting: Neurology

## 2015-09-25 VITALS — BP 140/90 | HR 50 | Ht 65.0 in | Wt 183.2 lb

## 2015-09-25 DIAGNOSIS — F0280 Dementia in other diseases classified elsewhere without behavioral disturbance: Secondary | ICD-10-CM

## 2015-09-25 DIAGNOSIS — G301 Alzheimer's disease with late onset: Secondary | ICD-10-CM | POA: Diagnosis not present

## 2015-09-25 NOTE — Patient Instructions (Signed)
1.  Continue donepezil and memantine 2.  Follow up in 9 months or as needed.  Alzheimer Disease Caregiver Guide Alzheimer disease is an illness that affects a person's brain. It causes a person to lose the ability to remember things and make good decisions. As the disease progresses, the person is unable to take care of himself or herself and needs more and more help to do simple tasks. Taking care of someone with Alzheimer disease can be very challenging and overwhelming.  MEMORY LOSS AND CONFUSION Memory loss and confusion is mild in the beginning stages of the disease. Both of these problems become more severe as the disease progresses. Eventually, the person will not recognize places or even close family members and friends.   Stay calm.  Respond with a short explanation. Long explanations can be overwhelming and confusing.  Avoid corrections that sound like scolding.  Try not to take it personally, even if the person forgets your name. BEHAVIOR CHANGES Behavior changes are part of the disease. The person may develop depression, anxiety, anger, hallucinations, or other behavior changes. These changes can come on suddenly and may be in response to pain, infection, changes in the environment (temperature, noise), overstimulation, or feeling lost or scared.   Try not to take behavior changes personally.  Remain calm and patient.  Do not argue or try to convince the person about a specific point. This will only make him or her more agitated.  Know that the behavior changes are part of the disease process and try to work through it. TIPS TO REDUCE FRUSTRATION  Schedule wisely by making appointments and doing daily tasks, like bathing and dressing, when the person is at his or her best.  Take your time. Simple tasks may take a lot longer, so be sure to allow for plenty of time.  Limit choices. Too many choices can be overwhelming and stressful for the person.  Involve the person in  what you are doing.  Stick to a routine.  Avoid new or crowded situations, if possible.  Use simple words, short sentences, and a calm voice. Only give one direction at a time.  Buy clothes and shoes that are easy to put on and take off.  Let people help if they offer. HOME SAFETY Keeping the home safe is very important to reduce the risk of falls and injuries.   Keep floors clear of clutter. Remove rugs, magazine racks, and floor lamps.  Keep hallways well lit.  Put a handrail and nonslip mat in the bathtub or shower.  Put childproof locks on cabinets with dangerous items, such as medicine, alcohol, guns, toxic cleaning items, sharp tools or utensils, matches, or lighters.  Place locks on doors where the person cannot easily see or reach them. This helps ensure that the person cannot wander out of the house and get lost.  Be prepared for emergencies. Keep a list of emergency phone numbers and addresses in a convenient area. PLANS FOR THE FUTURE  Do not put off talking about finances.  Talk about money management. People with Alzheimer disease have trouble managing their money as the disease gets worse.  Get help from professional advisors regarding financial and legal matters.  Do not put off talking about future care.  Choose a power of attorney. This is someone who can make decisions for the person with Alzheimer disease when he or she is no longer able to do so.  Talk about driving and when it is the right time  to stop. The person's health care provider can help give advice on this matter.  Talk about the person's living situation. If he or she lives alone, you need to make sure he or she is safe. Some people need extra help at home, and others need more care at a nursing home or care center. SUPPORT GROUPS Joining a support group can be very helpful for caregivers of people with Alzheimer disease. Some advantages to being part of a support group include:   Getting  strategies to manage stress.  Sharing experiences with others.  Receiving emotional comfort and support.  Learning new caregiving skills as the disease progresses.  Knowing what community resources are available and taking advantage of them. SEEK MEDICAL CARE IF:  The person has a fever.  The person has a sudden change in behavior that does not improve with calming strategies.  The person is unable to manage in his or her current living situation.  The person threatens you or anyone else, including himself or herself.  You are no longer able to care for the person.   This information is not intended to replace advice given to you by your health care provider. Make sure you discuss any questions you have with your health care provider.   Document Released: 09/04/2003 Document Revised: 01/13/2014 Document Reviewed: 01/29/2011 Elsevier Interactive Patient Education [employer]

## 2015-09-25 NOTE — Progress Notes (Signed)
Note routed

## 2015-09-25 NOTE — Progress Notes (Signed)
NEUROLOGY FOLLOW UP OFFICE NOTE  Leah Castaneda [id number]  HISTORY OF PRESENT ILLNESS: Leah Castaneda is a 75 year old right-handed woman with hypertension, hypercholesterolemia, and hearing loss who follows up for Alzheimer's dementia.  MRI of brain and labs reviewed.  She is accompanied by her daughter and son-in-law who supplement history.   UPDATE: She is taking Namenda and Aricept.  Overall, she is doing well.  She has increased word-finding difficulty and difficulty completing sentences.  She still is able to dress, bathe and use the toilet herself.  She now needs her children to set up her pillbox and monitor her medication.  She still reads the paper daily.  Recently, she has gotten up in the middle of the night to check and see if her children are home.  Overall, she sleeps well.  She is not combative.  She denies depression.  She has not had any change in personality or behavior.  Her appetite is good.  She does not enjoy going outside as much anymore.   HISTORY: She began having memory problems shortly after her husband passed away in February 29, 2012.  At first, it was thought to be related to depression, but the memory problems gradually progressed.  She often forgets where she placed objects, such as in the kitchen.  On occasion, she has left her keys and wallet at the grocery store.  She has at least twice gotten lost while driving.  However, on one of those occasions, it occurred at night where there was construction taking place.  She did not have any accidents or near-accidents.  She will have both word-finding difficulties and problems getting words out.  Sometimes, she will refer to "he" as "she" or vice versa.  She currently lives by herself in an attached apartment.  Her neighbor usually puts on the alarm at night, however she is unable to do it herself when her neighbor is away.  She has accidentally set off the alarm.  She has called family members in the middle of the night.      MRI of the brain performed on 12/23/13 showed disproportionate temporal and parietal lobe volume loss with mild nonspecific white matter changes.    PAST MEDICAL HISTORY: Past Medical History:  Diagnosis Date  . Hypertension     MEDICATIONS: Current Outpatient Prescriptions on File Prior to Visit  Medication Sig Dispense Refill  . atenolol (TENORMIN) 100 MG tablet Take 100 mg by mouth daily.    Marland Kitchen atorvastatin (LIPITOR) 10 MG tablet   3  . bacitracin ointment Apply 1 application topically 2 (two) times daily. 120 g 0  . donepezil (ARICEPT) 5 MG tablet Take 1 tablet (5 mg total) by mouth at bedtime. 90 tablet 4  . donepezil (ARICEPT) 5 MG tablet Take 2 tablets (10 mg total) by mouth at bedtime. 60 tablet 8  . hydrochlorothiazide (HYDRODIURIL) 12.5 MG tablet     . memantine (NAMENDA) 10 MG tablet Take 1 tablet (10 mg total) by mouth 2 (two) times daily. 180 tablet 2  . naproxen (NAPROSYN) 500 MG tablet Take 1 tablet (500 mg total) by mouth 2 (two) times daily with a meal. 30 tablet 0   No current facility-administered medications on file prior to visit.     ALLERGIES: No  FAMILY HISTORY: Family History  Problem Relation Age of Onset  . Cancer Brother     lung    SOCIAL HISTORY: Social History   Social History  . Marital status: Married  Spouse name: N/A  . Number of children: N/A  . Years of education: N/A   Occupational History  . Not on file.   Social History Main Topics  . Smoking status: Never Smoker  . Smokeless tobacco: Never Used  . Alcohol use No  . Drug use: No  . Sexual activity: No   Other Topics Concern  . Not on file   Social History Narrative   Graduated HS.     REVIEW OF SYSTEMS: Constitutional: No fevers, chills, or sweats, no generalized fatigue, change in appetite Eyes: No visual changes, double vision, eye pain Ear, nose and throat: No hearing loss, ear pain, nasal congestion, sore throat Cardiovascular: No chest pain,  palpitations Respiratory:  No shortness of breath at rest or with exertion, wheezes GastrointestinaI: No nausea, vomiting, diarrhea, abdominal pain, fecal incontinence Genitourinary:  No dysuria, urinary retention or frequency Musculoskeletal:  No neck pain, back pain Integumentary: No rash, pruritus, skin lesions Neurological: as above Psychiatric: No depression, insomnia, anxiety Endocrine: No palpitations, fatigue, diaphoresis, mood swings, change in appetite, change in weight, increased thirst Hematologic/Lymphatic:  No purpura, petechiae. Allergic/Immunologic: no itchy/runny eyes, nasal congestion, recent allergic reactions, rashes  PHYSICAL EXAM: Vitals:   09/25/15 1424  BP: 140/90  Pulse: (!) 50   General: No acute distress.  Patient appears well-groomed.  normal body habitus. Head:  Normocephalic/atraumatic Eyes:  Fundi examined but not visualized Neck: supple, no paraspinal tenderness, full range of motion Heart:  Regular rate and rhythm Lungs:  Clear to auscultation bilaterally Back: No paraspinal tenderness Neurological Exam: Due to prior low MoCA scores, MoCA not performed.  alert and oriented to person and city only. Attention span and concentration poor, delayed recall poor, remote memory intact, fund of knowledge fair.  Knows the president but couldn't recall his name.  Speech fluent and not dysarthric, Difficulty completing sentences, naming, repeating and following some commands   CN II-XII intact. Bulk and tone normal, muscle strength 5/5 throughout.  Sensation to light touch  intact.  Deep tendon reflexes 2+ throughout.  Finger to nose testing intact.  Gait normal,   IMPRESSION: Alzheimer's disease Bradycardia  PLAN: 1.  Continue Aricept and Namenda 2.  Monitor medication intake 3.  Information regarding Alzheimer's disease for caregiver provided. 4.  Heart rate is 50 bpm.  Recommend follow up with Dr. Manus GunningEhinger. 5.  Follow up in 9 months or as needed.  25  minutes spent face to face with patient, over 50% spent counseling.  Shon MilletAdam Jaffe, DO  CC:  Blair Heysobert Ehinger, MD

## 2015-12-10 ENCOUNTER — Other Ambulatory Visit: Payer: Self-pay | Admitting: Neurology

## 2016-02-06 ENCOUNTER — Other Ambulatory Visit: Payer: Self-pay | Admitting: Neurology

## 2016-04-06 IMAGING — MR MR HEAD W/O CM
8 of 11 series · 28 of 48 positions shown · non-contrast
Comparison: None.

CLINICAL DATA: 73-year-old female with confusion, difficulty caring
on conversations. Suspected Alzheimer's dementia. Initial encounter.

EXAM:
MRI HEAD WITHOUT CONTRAST
TECHNIQUE: Multiplanar, multiecho pulse sequences of the brain and surrounding
structures were obtained without intravenous contrast.

[Series 2: FLAIR · sagittal · 5.0mm · 0.47mm/px · 2 of 24 slices shown (1 of 3)]
[im 1/24]
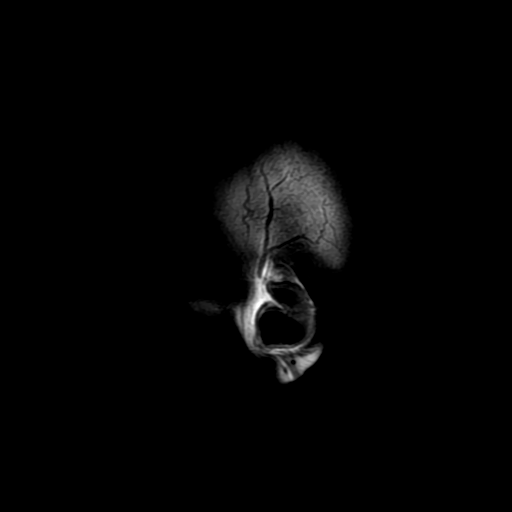
[im 24/24]
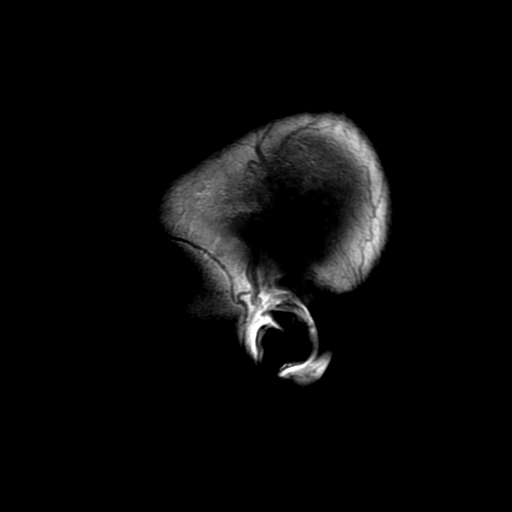

[Series 4: DWI · axial · 3.6mm · 1.02mm/px · z∈[-92,+50]mm · 5 of 81 slices shown (1 of 4)]
[im 1/81]
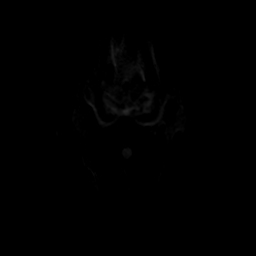
[im 21/81]
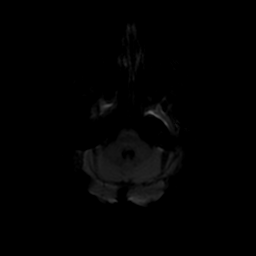
[im 41/81]
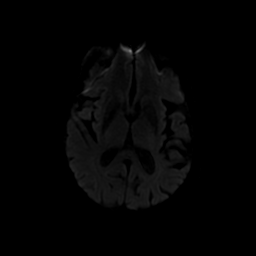
[im 61/81]
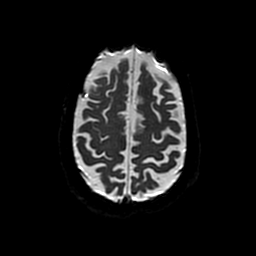
[im 81/81]
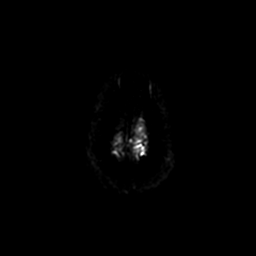

[Series 5: T2 · axial · 5.0mm · 0.43mm/px · 1 of 27 slices shown]
[im 1/27]
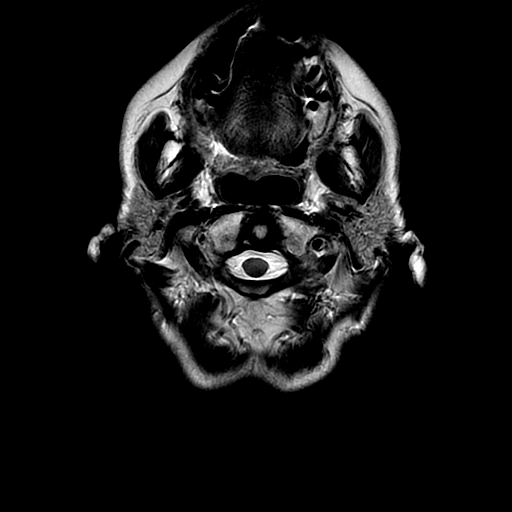

[Series 6: FLAIR · axial · 5.0mm · 0.43mm/px · z∈[-96,+57]mm · 2 of 27 slices shown (2 of 3)]
[im 1/27]
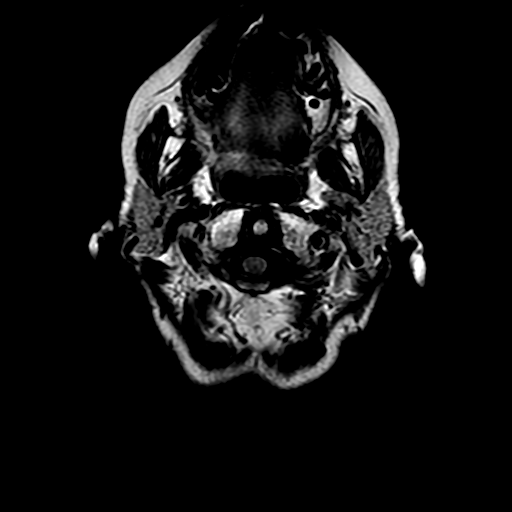
[im 27/27]
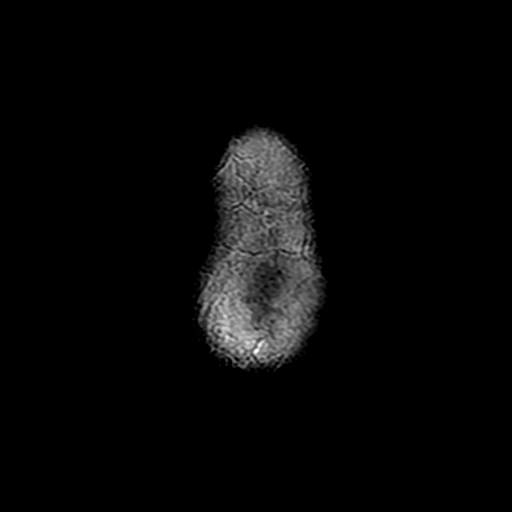

[Series 7: DWI · coronal · 5.0mm · 1.02mm/px · 5 of 81 slices shown (2 of 4)]
[im 1/81]
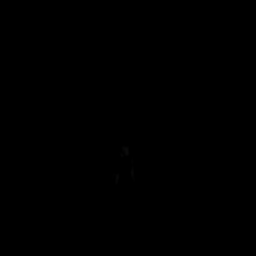
[im 21/81]
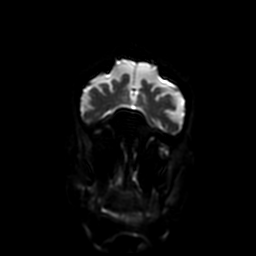
[im 41/81]
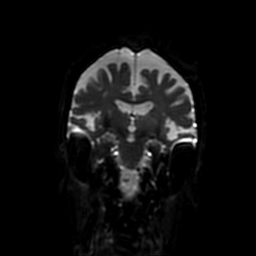
[im 61/81]
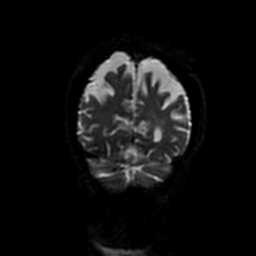
[im 81/81]
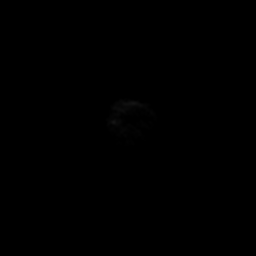

[Series 10: FLAIR · sagittal · 1.4mm · 0.50mm/px · 9 of 207 slices shown (3 of 3)]
[im 1/207]
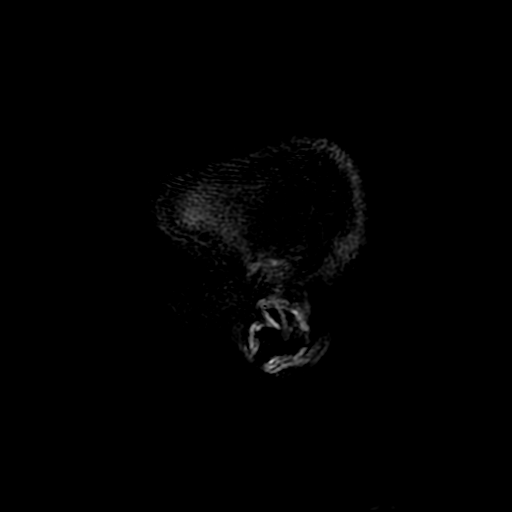
[im 38/207]
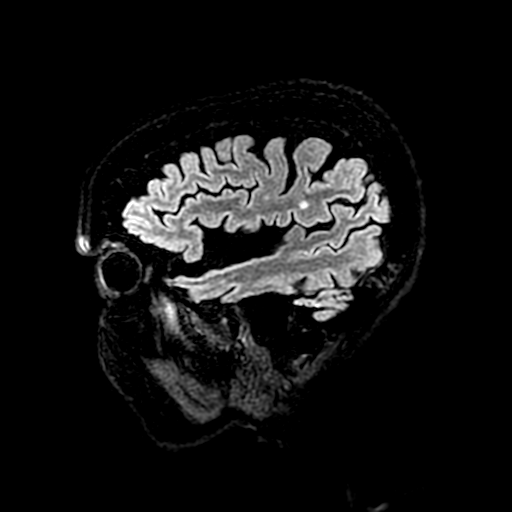
[im 57/207]
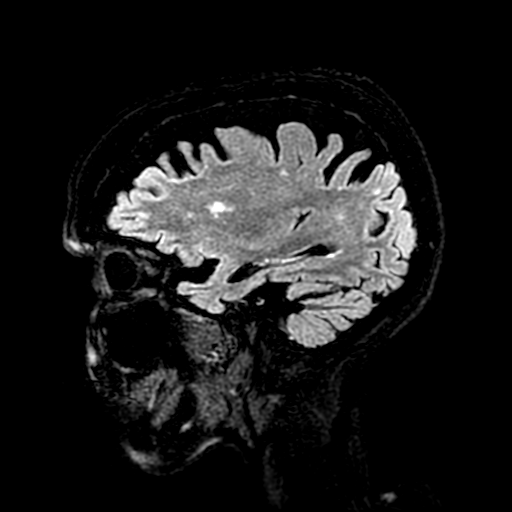
[im 94/207]
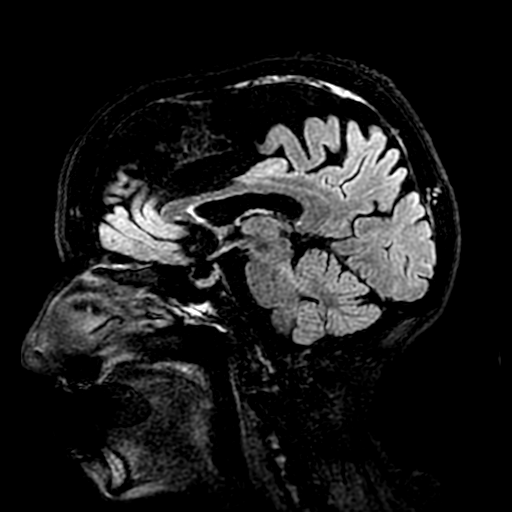
[im 113/207]
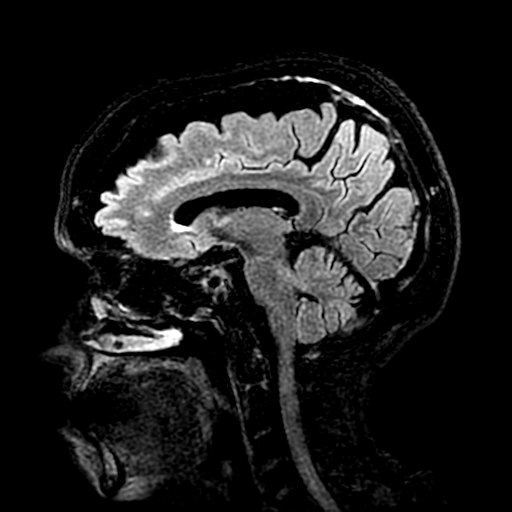
[im 150/207]
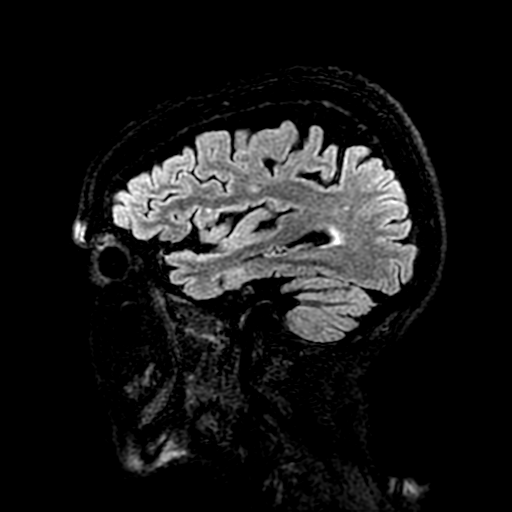
[im 169/207]
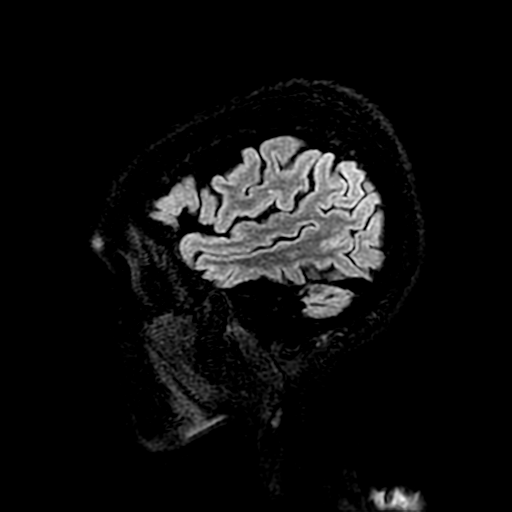
[im 188/207]
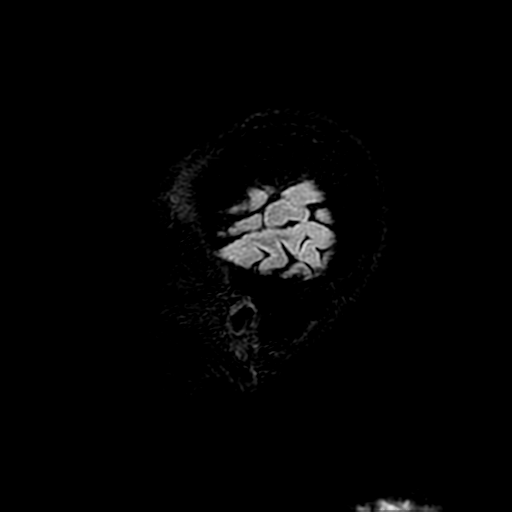
[im 207/207]
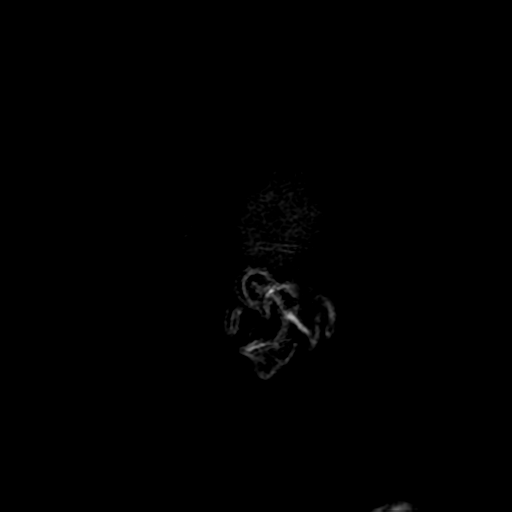

[Series 400: DWI · axial · 3.6mm · 1.02mm/px · z∈[-92,+50]mm · 2 of 41 slices shown (3 of 4)]
[im 1/41]
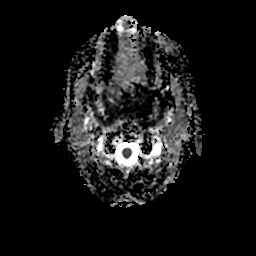
[im 41/41]
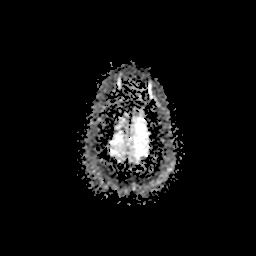

[Series 700: DWI · coronal · 5.0mm · 1.02mm/px · 2 of 41 slices shown (4 of 4)]
[im 1/41]
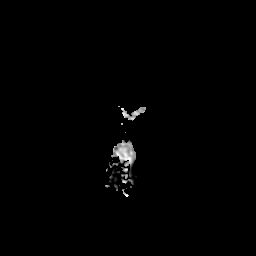
[im 41/41]
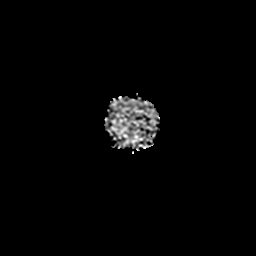

[28 of 48 positions shown; findings below may reference images not displayed]

FINDINGS: There is generalized cerebral volume. Temporal lobe and parietal
lobe volume loss does appear to be mildly disproportionate.

No restricted diffusion to suggest acute infarction. No midline
shift, mass effect, evidence of mass lesion, ventriculomegaly,
extra-axial collection or acute intracranial hemorrhage.
Cervicomedullary junction and pituitary are within normal limits.
Negative visualized cervical spine. Major intracranial vascular flow
voids are preserved, dominant distal left vertebral artery.

Scattered mostly small cerebral white matter foci of T2 and FLAIR
hyperintensity, in a nonspecific configuration. No cortical
encephalomalacia identified. No chronic blood products identified.
Deep gray matter nuclei, brainstem and cerebellum are within normal
limits for age.

Visible internal auditory structures appear normal. Visualized
paranasal sinuses and mastoids are clear. Visualized orbit soft
tissues are within normal limits. Normal bone marrow signal.
Visualized scalp soft tissues are within normal limits.
IMPRESSION: 1.  No acute intracranial abnormality.
2. Mild for age nonspecific cerebral white matter signal changes.
3. Suggestion of disproportionate temporal and parietal lobe volume
loss, superimposed on generalized cerebral volume loss.

## 2016-06-16 ENCOUNTER — Other Ambulatory Visit: Payer: Self-pay | Admitting: Neurology

## 2016-06-24 ENCOUNTER — Ambulatory Visit (INDEPENDENT_AMBULATORY_CARE_PROVIDER_SITE_OTHER): Payer: Medicare Other | Admitting: Neurology

## 2016-06-24 ENCOUNTER — Encounter: Payer: Self-pay | Admitting: Neurology

## 2016-06-24 VITALS — BP 130/80 | HR 56 | Ht 65.0 in | Wt 184.0 lb

## 2016-06-24 DIAGNOSIS — F0280 Dementia in other diseases classified elsewhere without behavioral disturbance: Secondary | ICD-10-CM

## 2016-06-24 DIAGNOSIS — G301 Alzheimer's disease with late onset: Secondary | ICD-10-CM

## 2016-06-24 NOTE — Patient Instructions (Signed)
1.  Continue Aricept and Namenda 2.  24 hour supervision 3.  Regarding hallucinations, as long as she is not scared or agitated, I wouldn't start an antipsychotic. 4.  Try increasing melatonin to 10mg  at bedtime 5.  Follow up in 9 months

## 2016-06-24 NOTE — Progress Notes (Signed)
NEUROLOGY FOLLOW UP OFFICE NOTE  Leah Castaneda [id number]  HISTORY OF PRESENT ILLNESS: Leah Castaneda is a 76 year old right-handed woman with hypertension, hypercholesterolemia, and hearing loss who follows up for Alzheimer's dementia.  MRI of brain and labs reviewed.  She is accompanied by her daughter and son-in-law who supplement history.   UPDATE: She is taking Namenda and Aricept.  She is living with her daughter and son-in-law.  She naps often during the day and will have trouble sleeping at night.  She will get up and walk around the house but never wanders outside.  She takes melatonin 5mg .  She will talk to a "woman" in the mirror from 1 up to 15 minutes.  She is not scared or agitated.  She speaks clearly when talking to this "woman" but has word-finding difficulty when speaking to people.  She is able to bath, dress and use the toilet herself.  She denies depression.  She reads the paper daily.  She has some trouble with names but not faces.   HISTORY: She began having memory problems shortly after her husband passed away in February 2014.  At first, it was thought to be related to depression, but the memory problems gradually progressed.  She often forgets where she placed objects, such as in the kitchen.  On occasion, she has left her keys and wallet at the grocery store.  She has at least twice gotten lost while driving.  However, on one of those occasions, it occurred at night where there was construction taking place.  She did not have any accidents or near-accidents.  She will have both word-finding difficulties and problems getting words out.  Sometimes, she will refer to "he" as "she" or vice versa.  She does not drive.   MRI of the brain performed on 12/23/13 showed disproportionate temporal and parietal lobe volume loss with mild nonspecific white matter changes.    PAST MEDICAL HISTORY: Past Medical History:  Diagnosis Date  . Hypertension     MEDICATIONS: Current  Outpatient Prescriptions on File Prior to Visit  Medication Sig Dispense Refill  . atenolol (TENORMIN) 100 MG tablet Take 100 mg by mouth daily.    Marland Kitchen. atorvastatin (LIPITOR) 10 MG tablet   3  . bacitracin ointment Apply 1 application topically 2 (two) times daily. 120 g 0  . donepezil (ARICEPT) 5 MG tablet Take 2 tablets (10 mg total) by mouth at bedtime. 60 tablet 3  . hydrochlorothiazide (HYDRODIURIL) 12.5 MG tablet     . memantine (NAMENDA) 10 MG tablet TAKE 1 TABLET BY MOUTH TWO  TIMES DAILY 180 tablet 3  . naproxen (NAPROSYN) 500 MG tablet Take 1 tablet (500 mg total) by mouth 2 (two) times daily with a meal. 30 tablet 0   No current facility-administered medications on file prior to visit.     ALLERGIES: Not on File  FAMILY HISTORY: Family History  Problem Relation Age of Onset  . Cancer Brother        lung    SOCIAL HISTORY: Social History   Social History  . Marital status: Married    Spouse name: N/A  . Number of children: N/A  . Years of education: N/A   Occupational History  . Not on file.   Social History Main Topics  . Smoking status: Never Smoker  . Smokeless tobacco: Never Used  . Alcohol use No  . Drug use: No  . Sexual activity: No   Other Topics Concern  .  Not on file   Social History Narrative   Graduated HS.     REVIEW OF SYSTEMS: Constitutional: No fevers, chills, or sweats, no generalized fatigue, change in appetite Eyes: No visual changes, double vision, eye pain Ear, nose and throat: No hearing loss, ear pain, nasal congestion, sore throat Cardiovascular: No chest pain, palpitations Respiratory:  No shortness of breath at rest or with exertion, wheezes GastrointestinaI: No nausea, vomiting, diarrhea, abdominal pain, fecal incontinence Genitourinary:  No dysuria, urinary retention or frequency Musculoskeletal:  No neck pain, back pain Integumentary: No rash, pruritus, skin lesions Neurological: as above Psychiatric: No depression,  insomnia, anxiety Endocrine: No palpitations, fatigue, diaphoresis, mood swings, change in appetite, change in weight, increased thirst Hematologic/Lymphatic:  No purpura, petechiae. Allergic/Immunologic: no itchy/runny eyes, nasal congestion, recent allergic reactions, rashes  PHYSICAL EXAM: Vitals:   06/24/16 1427  BP: 130/80  Pulse: (!) 56   General: No acute distress.  Patient appears well-groomed.  normal body habitus. Head:  Normocephalic/atraumatic Eyes:  Fundi examined but not visualized Neck: supple, no paraspinal tenderness, full range of motion Heart:  Regular rate and rhythm Lungs:  Clear to auscultation bilaterally Back: No paraspinal tenderness Neurological Exam: Due to prior low MoCA scores, MoCA not performed.  alert and oriented to person only. Attention span and concentration poor, delayed recall poor, remote memory intact, fund of knowledge poor.  Knows Speech fluent and not dysarthric, Difficulty completing sentences, naming, repeating and following some commands   CN II-XII intact. Bulk and tone normal, muscle strength 5/5 throughout.  Sensation to light touch  intact.  Deep tendon reflexes 2+ throughout, toes downgoing  Finger to nose testing intact.  Gait normal  IMPRESSION: Alzheimer's dementia  PLAN: 1.  Continue Aricept and Namenda 2.  24 hour supervision 3.  Regarding hallucinations, as long as she is not scared or agitated, I wouldn't start an antipsychotic. 4.  Try increasing melatonin to 10mg  at bedtime 5.  Follow up in 9 months   25 minutes spent face to face with patient, over 50% spent discussing management.  [name], DO  CC:  Blair Heys, MD

## 2016-07-01 ENCOUNTER — Telehealth: Payer: Self-pay | Admitting: Neurology

## 2016-07-01 MED ORDER — DONEPEZIL HCL 5 MG PO TABS: 5.0000 mg | ORAL_TABLET | Freq: Every day | ORAL | 1 refills | Status: DC

## 2016-07-01 NOTE — Telephone Encounter (Signed)
Caller: Rosey Batheresa (Daughter)  Urgent? No  Reason for the call: She was calling in a refill for her mom through [facility] for the medication Aricept and she was told it needed Pre Approval. It also had on there 2 x a day which normally she only takes one a day? She said the Memantine she takes 2 x a day. She would like you to please call her regarding this. Thanks

## 2016-07-01 NOTE — Telephone Encounter (Signed)
RX was written for Aricept 5 mg - 2 tablets at night. Patient only takes one at night. RX sent in for dose she is taking. Daughter made aware.

## 2016-09-16 ENCOUNTER — Other Ambulatory Visit: Payer: Self-pay | Admitting: Neurology

## 2016-12-25 ENCOUNTER — Other Ambulatory Visit: Payer: Self-pay | Admitting: Neurology

## 2017-03-24 ENCOUNTER — Encounter: Payer: Self-pay | Admitting: Neurology

## 2017-03-24 ENCOUNTER — Ambulatory Visit: Payer: Medicare Other | Admitting: Neurology

## 2017-03-24 VITALS — BP 108/60 | HR 88 | Ht 65.0 in | Wt 154.2 lb

## 2017-03-24 DIAGNOSIS — G301 Alzheimer's disease with late onset: Secondary | ICD-10-CM

## 2017-03-24 DIAGNOSIS — F0280 Dementia in other diseases classified elsewhere without behavioral disturbance: Secondary | ICD-10-CM | POA: Diagnosis not present

## 2017-03-24 MED ORDER — DONEPEZIL HCL 10 MG PO TABS: 10.0000 mg | ORAL_TABLET | Freq: Every day | ORAL | 8 refills | Status: AC

## 2017-03-24 NOTE — Progress Notes (Signed)
NEUROLOGY FOLLOW UP OFFICE NOTE  Leah Castaneda [id number]  HISTORY OF PRESENT ILLNESS: Leah Castaneda is a 77 year old right-handed woman with hypertension, hypercholesterolemia, and hearing loss who follows up for Alzheimer's dementia.  She is accompanied by her daughter and son-in-law who supplement history.   UPDATE: She is taking Namenda and Aricept, however she is now taking 5mg  at bedtime.  She is living with her daughter and son-in-law.  She naps often during the day and will have trouble sleeping at night.  She will get up and walk around the house but never wanders outside.  She takes melatonin 10mg .  She will talk to a "woman" in the mirror from 1 up to 15 minutes.  She frequently hallucinates and carries on conversations with people who are not there.  Typically, it does not cause any fear or distress.  She no longer has interest in activities such as reading or watching TV.  She doesn't like leaving the house.  Her daughter will lay out her clothes but she is able to dress herself.  Her children fix her food but she doesn't always eat.  She is able to use the toilet herself, but will dispose of the toilet paper in the trash rather than the toilet.  Her daughter stands by when she showers.   HISTORY: She began having memory problems shortly after her husband passed away in February 2014.  At first, it was thought to be related to depression, but the memory problems gradually progressed.  She often forgets where she placed objects, such as in the kitchen.  On occasion, she has left her keys and wallet at the grocery store.  She has at least twice gotten lost while driving.  However, on one of those occasions, it occurred at night where there was construction taking place.  She did not have any accidents or near-accidents.  She will have both word-finding difficulties and problems getting words out.  Sometimes, she will refer to "he" as "she" or vice versa.  She does not drive.   MRI of  the brain performed on 12/23/13 showed disproportionate temporal and parietal lobe volume loss with mild nonspecific white matter changes.    PAST MEDICAL HISTORY: Past Medical History:  Diagnosis Date  . Hypertension     MEDICATIONS: Current Outpatient Medications on File Prior to Visit  Medication Sig Dispense Refill  . atenolol (TENORMIN) 100 MG tablet Take 100 mg by mouth daily.    Marland Kitchen. atorvastatin (LIPITOR) 10 MG tablet   3  . bacitracin ointment Apply 1 application topically 2 (two) times daily. 120 g 0  . memantine (NAMENDA) 10 MG tablet TAKE 1 TABLET BY MOUTH TWO  TIMES DAILY 180 tablet 3  . metoprolol succinate (TOPROL-XL) 100 MG 24 hr tablet     . naproxen (NAPROSYN) 500 MG tablet Take 1 tablet (500 mg total) by mouth 2 (two) times daily with a meal. 30 tablet 0  . spironolactone (ALDACTONE) 25 MG tablet      No current facility-administered medications on file prior to visit.     ALLERGIES: No Known Allergies  FAMILY HISTORY: Family History  Problem Relation Age of Onset  . Cancer Brother        lung    SOCIAL HISTORY: Social History   Socioeconomic History  . Marital status: Married    Spouse name: Not on file  . Number of children: Not on file  . Years of education: Not on file  .  Highest education level: Not on file  Social Needs  . Financial resource strain: Not on file  . Food insecurity - worry: Not on file  . Food insecurity - inability: Not on file  . Transportation needs - medical: Not on file  . Transportation needs - non-medical: Not on file  Occupational History  . Not on file  Tobacco Use  . Smoking status: Never Smoker  . Smokeless tobacco: Never Used  Substance and Sexual Activity  . Alcohol use: No    Alcohol/week: 0.0 oz  . Drug use: No  . Sexual activity: No  Other Topics Concern  . Not on file  Social History Narrative   Graduated HS.     REVIEW OF SYSTEMS: Constitutional: No fevers, chills, or sweats, no generalized fatigue,  change in appetite Eyes: No visual changes, double vision, eye pain Ear, nose and throat: No hearing loss, ear pain, nasal congestion, sore throat Cardiovascular: No chest pain, palpitations Respiratory:  No shortness of breath at rest or with exertion, wheezes GastrointestinaI: No nausea, vomiting, diarrhea, abdominal pain, fecal incontinence Genitourinary:  No dysuria, urinary retention or frequency Musculoskeletal:  No neck pain, back pain Integumentary: No rash, pruritus, skin lesions Neurological: as above Psychiatric: No depression, insomnia, anxiety Endocrine: No palpitations, fatigue, diaphoresis, mood swings, change in appetite, change in weight, increased thirst Hematologic/Lymphatic:  No purpura, petechiae. Allergic/Immunologic: no itchy/runny eyes, nasal congestion, recent allergic reactions, rashes  PHYSICAL EXAM: Vitals:   03/24/17 1420  BP: 108/60  Pulse: 88  SpO2: 90%   General: No acute distress.  Patient appears well-groomed.   Head:  Normocephalic/atraumatic Eyes:  Fundi examined but not visualized Neck: supple, no paraspinal tenderness, full range of motion Heart:  Regular rate and rhythm Lungs:  Clear to auscultation bilaterally Back: No paraspinal tenderness Neurological Exam: Due to prior low MoCA scores, MoCA not performed.  alert and oriented to person only. Attention span and concentration poor, delayed recall poor, remote memory intact, fund of knowledge poor.  Speech fluent and not dysarthric, Difficulty completing sentences, naming, repeating and following some commands   CN II-XII intact. Bulk and tone normal, muscle strength 5/5 throughout.  Sensation to light touch  intact.  Deep tendon reflexes 2+ throughout.  Finger to nose testing intact.  Gait normal  IMPRESSION: Alzheimer's dementia  PLAN: 1. Increase Aricept to 10mg  at bedtime.  Continue memantine. 2.  24 hour supervision.  Provided family with information regarding resources, including the  [employer]'s 24 hour hot line. 3.  Regarding hallucinations, as long as she is not scared or agitated, I wouldn't start an antipsychotic.  We discussed Seroquel and black box warning of increased risk of mortality/morbidity. 4.  Follow up in 9 months or as needed.  25 minutes spent face to face with patient, over 50% spent discussing management.   [name], DO  CC:  Dr. Manus Gunning

## 2017-03-24 NOTE — Patient Instructions (Signed)
I increased donepezil to 10mg  at bedtime Continue current dose of memantine Please review information/resources regarding Alzheimer's Follow up in 9 months or as needed.

## 2017-12-24 ENCOUNTER — Ambulatory Visit: Payer: Medicare Other | Admitting: Neurology

## 2020-01-07 DEATH — deceased
